# Patient Record
Sex: Female | Born: 1985 | Race: Black or African American | Hispanic: No | Marital: Single | Smoking: Current every day smoker
Health system: Southern US, Community
[De-identification: ages and names within clinical notes are randomized; demographics above are authoritative.]

## PROBLEM LIST (undated history)

## (undated) HISTORY — PX: BREAST SURGERY: SHX581

---

## 2008-07-25 ENCOUNTER — Emergency Department (HOSPITAL_COMMUNITY): Admission: EM | Admit: 2008-07-25 | Discharge: 2008-07-25 | Payer: Self-pay | Admitting: Emergency Medicine

## 2008-10-04 ENCOUNTER — Inpatient Hospital Stay (HOSPITAL_COMMUNITY): Admission: AD | Admit: 2008-10-04 | Discharge: 2008-10-04 | Payer: Self-pay | Admitting: Obstetrics & Gynecology

## 2010-12-18 LAB — GC/CHLAMYDIA PROBE AMP, GENITAL: GC Probe Amp, Genital: NEGATIVE

## 2010-12-18 LAB — URINALYSIS, ROUTINE W REFLEX MICROSCOPIC
Bilirubin Urine: NEGATIVE
Hgb urine dipstick: NEGATIVE
Protein, ur: NEGATIVE mg/dL
Specific Gravity, Urine: 1.01 (ref 1.005–1.030)
Urobilinogen, UA: 0.2 mg/dL (ref 0.0–1.0)

## 2010-12-18 LAB — WET PREP, GENITAL: Trich, Wet Prep: NONE SEEN

## 2011-06-04 LAB — POCT URINALYSIS DIP (DEVICE)
Glucose, UA: NEGATIVE mg/dL
Hgb urine dipstick: NEGATIVE
Nitrite: NEGATIVE
Urobilinogen, UA: 1 mg/dL (ref 0.0–1.0)
pH: 6 (ref 5.0–8.0)

## 2013-07-13 ENCOUNTER — Encounter (HOSPITAL_COMMUNITY): Payer: Self-pay | Admitting: Emergency Medicine

## 2013-07-13 ENCOUNTER — Emergency Department (HOSPITAL_COMMUNITY)
Admission: EM | Admit: 2013-07-13 | Discharge: 2013-07-13 | Disposition: A | Discharge status: Home / Self Care | Attending: Emergency Medicine | Admitting: Emergency Medicine

## 2013-07-13 ENCOUNTER — Emergency Department (HOSPITAL_COMMUNITY)

## 2013-07-13 DIAGNOSIS — R1013 Epigastric pain: Secondary | ICD-10-CM

## 2013-07-13 DIAGNOSIS — M549 Dorsalgia, unspecified: Secondary | ICD-10-CM | POA: Insufficient documentation

## 2013-07-13 DIAGNOSIS — Z3202 Encounter for pregnancy test, result negative: Secondary | ICD-10-CM | POA: Insufficient documentation

## 2013-07-13 DIAGNOSIS — K2970 Gastritis, unspecified, without bleeding: Secondary | ICD-10-CM

## 2013-07-13 LAB — URINALYSIS, ROUTINE W REFLEX MICROSCOPIC
Bilirubin Urine: NEGATIVE
Hgb urine dipstick: NEGATIVE
Nitrite: NEGATIVE
Specific Gravity, Urine: 1.027 (ref 1.005–1.030)
pH: 6 (ref 5.0–8.0)

## 2013-07-13 LAB — COMPREHENSIVE METABOLIC PANEL
ALT: 30 U/L (ref 0–35)
Albumin: 4.1 g/dL (ref 3.5–5.2)
Alkaline Phosphatase: 63 U/L (ref 39–117)
Potassium: 3.8 mEq/L (ref 3.5–5.1)
Sodium: 137 mEq/L (ref 135–145)
Total Protein: 8.2 g/dL (ref 6.0–8.3)

## 2013-07-13 LAB — CBC WITH DIFFERENTIAL/PLATELET
Basophils Absolute: 0 10*3/uL (ref 0.0–0.1)
Basophils Relative: 1 % (ref 0–1)
Eosinophils Absolute: 0.1 10*3/uL (ref 0.0–0.7)
MCH: 29.3 pg (ref 26.0–34.0)
MCHC: 34 g/dL (ref 30.0–36.0)
Neutrophils Relative %: 46 % (ref 43–77)
Platelets: 254 10*3/uL (ref 150–400)
RBC: 4.92 MIL/uL (ref 3.87–5.11)
RDW: 12.8 % (ref 11.5–15.5)

## 2013-07-13 LAB — POCT I-STAT TROPONIN I: Troponin i, poc: 0 ng/mL (ref 0.00–0.08)

## 2013-07-13 LAB — POCT PREGNANCY, URINE: Preg Test, Ur: NEGATIVE

## 2013-07-13 MED ORDER — GI COCKTAIL ~~LOC~~
30.0000 mL | Freq: Once | ORAL | Status: AC
  Administered 2013-07-13: 30 mL via ORAL
  Filled 2013-07-13: qty 30

## 2013-07-13 MED ORDER — ONDANSETRON HCL 4 MG PO TABS: 4.0000 mg | ORAL_TABLET | Freq: Four times a day (QID) | ORAL | Status: AC

## 2013-07-13 MED ORDER — MORPHINE SULFATE 4 MG/ML IJ SOLN
4.0000 mg | Freq: Once | INTRAMUSCULAR | Status: AC
  Administered 2013-07-13: 4 mg via INTRAVENOUS
  Filled 2013-07-13: qty 1

## 2013-07-13 MED ORDER — HYDROCODONE-ACETAMINOPHEN 5-325 MG PO TABS: 2.0000 | ORAL_TABLET | Freq: Four times a day (QID) | ORAL | Status: AC | PRN

## 2013-07-13 MED ORDER — RANITIDINE HCL 150 MG PO CAPS: 150.0000 mg | ORAL_CAPSULE | Freq: Every day | ORAL | Status: AC

## 2013-07-13 MED ORDER — ONDANSETRON HCL 4 MG/2ML IJ SOLN
4.0000 mg | Freq: Once | INTRAMUSCULAR | Status: AC
  Administered 2013-07-13: 4 mg via INTRAVENOUS
  Filled 2013-07-13: qty 2

## 2013-07-13 MED ORDER — SODIUM CHLORIDE 0.9 % IV BOLUS (SEPSIS)
1000.0000 mL | Freq: Once | INTRAVENOUS | Status: AC
  Administered 2013-07-13: 1000 mL via INTRAVENOUS

## 2013-07-13 NOTE — ED Provider Notes (Signed)
Medical screening examination/treatment/procedure(s) were conducted as a shared visit with non-physician practitioner(s) and myself.  I personally evaluated the patient during the encounter.  EKG Interpretation     Ventricular Rate:  90 PR Interval:  201 QRS Duration: 66 QT Interval:  333 QTC Calculation: 407 R Axis:   55 Text Interpretation:  Sinus rhythm Probable left atrial enlargement Baseline wander No old tracing to compare              27 yo female with epigastric abdominal pain.  On exam, soft, TTP in epigastrium, no r/r/g.  Labs and US unremarkable.  No signs of surgical abdomen.  Plan continued outpatient treatment.  Clinical Impression: 1. Epigastric pain   2. Gastritis       Candyce Churn, MD 07/13/13 [phone]

## 2013-07-13 NOTE — ED Provider Notes (Signed)
Medical screening examination/treatment/procedure(s) were conducted as a shared visit with non-physician practitioner(s) and myself.  I personally evaluated the patient during the encounter.   Please see my separate note.     Candyce Churn, MD 07/13/13 [phone]

## 2013-07-13 NOTE — ED Notes (Signed)
Pt is here with mid to upper abdominal pain for the last 3 days, seen at urgent care yesterday and started on omeprazole.  Pt now reports vomited once last nite and twice this am..  LMP last week

## 2013-07-13 NOTE — ED Provider Notes (Signed)
CSN: [id number]     Arrival date & time 07/13/13  7829 History   First MD Initiated Contact with Patient 07/13/13 [phone]     Chief Complaint  Patient presents with  . Abdominal Pain   (Consider location/radiation/quality/duration/timing/severity/associated sxs/prior Treatment) HPI Comments: Patient is a 27 year old G3 P0 female who presents today with 3 days of worsening abdominal pain. She reports the pain is a dull achy pain in her epigastric area with some radiation to her back. She was seen at urgent care when the pain started 3 days ago and was given omeprazole. She's been taking the omeprazole and no relief of her symptoms. She has been taking omeprazole as well as Midol which has not helped her symptoms. The Midol helps her sleep at night. She has had associated vomiting. Her emesis is nonbloody. Her last bowel movement was yesterday. She denies any darkening of her stools or hematochezia. She does note that it appears her stools are somewhat looser than normal. Eating does not make her pain better or worse. She has only been eating crackers for the past few days. She denies any fever, chills, vaginal discharge, vaginal bleeding, dysuria, urinary urgency, urinary frequency. Last menstrual period was last week.  The history is provided by the patient. No language interpreter was used.    No past medical history on file. No past surgical history on file. No family history on file. History  Substance Use Topics  . Smoking status: Not on file  . Smokeless tobacco: Not on file  . Alcohol Use: Not on file   OB History   No data available     Review of Systems  Constitutional: Negative for fever and chills.  Respiratory: Negative for shortness of breath.   Cardiovascular: Negative for chest pain.  Gastrointestinal: Positive for nausea, vomiting, abdominal pain and abdominal distention. Negative for diarrhea, constipation and blood in stool.  Genitourinary: Negative for dysuria, urgency,  frequency, hematuria, vaginal discharge and vaginal pain.  Musculoskeletal: Positive for back pain.  All other systems reviewed and are negative.    Allergies  Review of patient's allergies indicates no known allergies.  Home Medications  No current outpatient prescriptions on file. BP 145/84  Pulse 82  Temp(Src) 98.8 F (37.1 C) (Oral)  SpO2 99% Physical Exam  Nursing note and vitals reviewed. Constitutional: She is oriented to person, place, and time. She appears well-developed and well-nourished. No distress.  HENT:  Head: Normocephalic and atraumatic.  Right Ear: External ear normal.  Left Ear: External ear normal.  Nose: Nose normal.  Mouth/Throat: Oropharynx is clear and moist.  Eyes: Conjunctivae are normal.  Neck: Normal range of motion.  Cardiovascular: Normal rate, regular rhythm and normal heart sounds.   Pulmonary/Chest: Effort normal and breath sounds normal. No stridor. No respiratory distress. She has no wheezes. She has no rales.  Abdominal: Soft. She exhibits no distension. There is tenderness in the epigastric area. There is no rigidity, no rebound, no guarding, no tenderness at McBurney's point and negative Murphy's sign.  Musculoskeletal: Normal range of motion.  Neurological: She is alert and oriented to person, place, and time. She has normal strength.  Skin: Skin is warm and dry. She is not diaphoretic. No erythema.  Psychiatric: She has a normal mood and affect. Her behavior is normal.    ED Course  Procedures (including critical care time) Labs Review Labs Reviewed  CBC WITH DIFFERENTIAL - Abnormal; Notable for the following:    WBC 3.6 (*)  Neutro Abs 1.6 (*)    Monocytes Relative 13 (*)    All other components within normal limits  COMPREHENSIVE METABOLIC PANEL  LIPASE, BLOOD  URINALYSIS, ROUTINE W REFLEX MICROSCOPIC  POCT PREGNANCY, URINE  POCT I-STAT TROPONIN I   Imaging Review US Abdomen Complete  07/13/2013   CLINICAL DATA:  Pain.   EXAM: ULTRASOUND ABDOMEN COMPLETE  COMPARISON:  None.  FINDINGS: Gallbladder  No gallstones or wall thickening visualized. No sonographic Murphy sign noted.  Common bile duct  Diameter: 4.7 mm.  Liver  No focal lesion identified. Within normal limits in parenchymal echogenicity.  IVC  No abnormality visualized.  Pancreas  Visualized portion unremarkable.  Spleen  Size and appearance within normal limits.  Right Kidney  Length: 10.9 cm. Echogenicity within normal limits. No mass or hydronephrosis visualized.  Left Kidney  Length: 11.4 cm. Echogenicity within normal limits. No mass or hydronephrosis visualized.  Abdominal aorta  No aneurysm visualized.  IMPRESSION: Negative exam.   Electronically Signed   By: Maisie Fus  Register   On: 07/13/2013 11:10    EKG Interpretation   None      11:49 AM Informed patient of labs and imaging. She is laughing and joking on the phone. Abd is soft and non surgical. She still complains of epigastric pain.   1:38 PM Patient worse after GI cocktail. Abd soft, nonsurgical. Dr. Loretha Stapler evaluated patient. Will give morphine and d/c with follow up once patient is more comfortable.   MDM   1. Epigastric pain   2. Gastritis     Patient is nontoxic, nonseptic appearing, in no apparent distress.  Patient's pain and other symptoms adequately managed in emergency department.  Fluid bolus given.  Labs, imaging and vitals reviewed.  Patient does not meet the SIRS or Sepsis criteria.  On repeat exam patient does not have a surgical abdomen and there are no peritoneal signs.  No indication of appendicitis, bowel obstruction, bowel perforation, cholecystitis, diverticulitis, PID or ectopic pregnancy.  Patient discharged home with symptomatic treatment and given strict instructions for follow-up with their primary care physician.  I have also discussed reasons to return immediately to the ER.  Patient expresses understanding and agrees with plan. Dr. Loretha Stapler evaluated patient and  agrees with plan. Patient is asymptomatic prior to discharge.   Mora Bellman, PA-C 07/13/13 [phone]

## 2014-12-02 ENCOUNTER — Encounter (HOSPITAL_COMMUNITY): Payer: Self-pay | Admitting: Vascular Surgery

## 2014-12-02 ENCOUNTER — Emergency Department (HOSPITAL_COMMUNITY)
Admission: EM | Admit: 2014-12-02 | Discharge: 2014-12-02 | Disposition: A | Discharge status: Home / Self Care | Attending: Emergency Medicine | Admitting: Emergency Medicine

## 2014-12-02 DIAGNOSIS — Z79899 Other long term (current) drug therapy: Secondary | ICD-10-CM | POA: Diagnosis not present

## 2014-12-02 DIAGNOSIS — J029 Acute pharyngitis, unspecified: Secondary | ICD-10-CM

## 2014-12-02 DIAGNOSIS — Z720 Tobacco use: Secondary | ICD-10-CM | POA: Diagnosis not present

## 2014-12-02 LAB — RAPID STREP SCREEN (MED CTR MEBANE ONLY): Streptococcus, Group A Screen (Direct): NEGATIVE

## 2014-12-02 MED ORDER — ACETAMINOPHEN 500 MG PO TABS
1000.0000 mg | ORAL_TABLET | Freq: Once | ORAL | Status: AC
  Administered 2014-12-02: 1000 mg via ORAL
  Filled 2014-12-02: qty 2

## 2014-12-02 NOTE — ED Provider Notes (Signed)
CSN: [id number]     Arrival date & time 12/02/14  1520 History   First MD Initiated Contact with Patient 12/02/14 [phone]     Chief Complaint  Patient presents with  . Sore Throat     (Consider location/radiation/quality/duration/timing/severity/associated sxs/prior Treatment) HPI Comments: 29 yo healthy female presents with sore throat worse the past two days with exudate right tonsil, no sick contacts, well otherwise tolerating po.  Mild pain with swallowing.  Patient is a 29 y.o. female presenting with pharyngitis. The history is provided by the patient.  Sore Throat Pertinent negatives include no headaches.    History reviewed. No pertinent past medical history. Past Surgical History  Procedure Laterality Date  . Breast surgery      reduction   No family history on file. History  Substance Use Topics  . Smoking status: Current Every Day Smoker    Types: Cigarettes  . Smokeless tobacco: Not on file  . Alcohol Use: Yes   OB History    No data available     Review of Systems  Constitutional: Negative for fever, chills and appetite change.  HENT: Positive for sore throat.   Neurological: Negative for headaches.      Allergies  Review of patient's allergies indicates no known allergies.  Home Medications   Prior to Admission medications   Medication Sig Start Date End Date Taking? Authorizing Provider  calcium carbonate (TUMS - DOSED IN MG ELEMENTAL CALCIUM) 500 MG chewable tablet Chew 4-5 tablets by mouth daily as needed for indigestion or heartburn.    Historical Provider, MD  HYDROcodone-acetaminophen (NORCO/VICODIN) 5-325 MG per tablet Take 2 tablets by mouth every 6 (six) hours as needed. 07/13/13   Junious Silk, PA-C  Ibuprofen (MIDOL) 200 MG CAPS Take 400 mg by mouth daily as needed (for pain).    Historical Provider, MD  omeprazole (PRILOSEC) 20 MG capsule Take 20 mg by mouth daily as needed (for heartburn).    Historical Provider, MD  ondansetron (ZOFRAN) 4  MG tablet Take 1 tablet (4 mg total) by mouth every 6 (six) hours. 07/13/13   Junious Silk, PA-C  ranitidine (ZANTAC) 150 MG capsule Take 1 capsule (150 mg total) by mouth daily. 07/13/13   Junious Silk, PA-C  Simethicone (GAS-X PO) Take 2-3 tablets by mouth daily as needed (for gas).    Historical Provider, MD   BP 122/85 mmHg  Pulse 103  Temp(Src) 98.4 F (36.9 C) (Oral)  Resp 18  Ht  (1.753 m)  Wt 205 lb (92.987 kg)  BMI 30.26 kg/m2  SpO2 100%  LMP 10/23/2014 Physical Exam  Constitutional: She appears well-developed and well-nourished. No distress.  HENT:  Head: Normocephalic and atraumatic.  No trismus, uvular deviation, unilateral posterior pharyngeal edema or submandibular swelling. Mild posterior erythema  Eyes: Conjunctivae are normal. Right eye exhibits no discharge. Left eye exhibits discharge.  Neck: Normal range of motion. Neck supple.  Lymphadenopathy:    She has cervical adenopathy (mild anterior).  Nursing note and vitals reviewed.   ED Course  Procedures (including critical care time) Labs Review Labs Reviewed  RAPID STREP SCREEN  CULTURE, GROUP A STREP    Imaging Review No results found.   EKG Interpretation None      MDM   Final diagnoses:  Pharyngitis    Clinically pharyngitis, mild erythema, no red flags. Strep and fup outpt.  Results and differential diagnosis were discussed with the patient/parent/guardian. Close follow up outpatient was discussed, comfortable with the plan.  Medications  acetaminophen (TYLENOL) tablet 1,000 mg (1,000 mg Oral Given 12/02/14 1624)    Filed Vitals:   12/02/14 1533  BP: 122/85  Pulse: 103  Temp: 98.4 F (36.9 C)  TempSrc: Oral  Resp: 18  Height: 5\' 9"  (1.753 m)  Weight: 205 lb (92.987 kg)  SpO2: 100%    Final diagnoses:  Pharyngitis        Blane OharaJoshua Marquelle Musgrave, MD 12/02/14 1651

## 2014-12-02 NOTE — Discharge Instructions (Signed)
If you were given medicines take as directed.  If you are on coumadin or contraceptives realize their levels and effectiveness is altered by many different medicines.  If you have any reaction (rash, tongues swelling, other) to the medicines stop taking and see a physician.   Please follow up as directed and return to the ER or see a physician for new or worsening symptoms.  Thank you. Filed Vitals:   12/02/14 1533  BP: 122/85  Pulse: 103  Temp: 98.4 F (36.9 C)  TempSrc: Oral  Resp: 18  Height: 5\' 9"  (1.753 m)  Weight: 205 lb (92.987 kg)  SpO2: 100%

## 2014-12-02 NOTE — ED Notes (Signed)
Pt reports to the ED for eval of sore throat that began last week. She reports the symptoms got worse 2 days ago. She noticed yesterday that she had some white exudate on her tonsils yesterday. None visible now. Tonsils slightly red and swollen. Airway intact. Pt A&Ox4, resp e/u, and skin warm and dry.

## 2014-12-05 LAB — CULTURE, GROUP A STREP: Strep A Culture: NEGATIVE

## 2015-04-05 IMAGING — US US ABDOMEN COMPLETE
1 series · 14 of 25 positions shown · non-contrast
Comparison: None.

CLINICAL DATA: Pain.

EXAM:
ULTRASOUND ABDOMEN COMPLETE

[Series 1: us abdomen complete · 0.20mm/px · 14 of 76 slices shown]
[im 1/76]
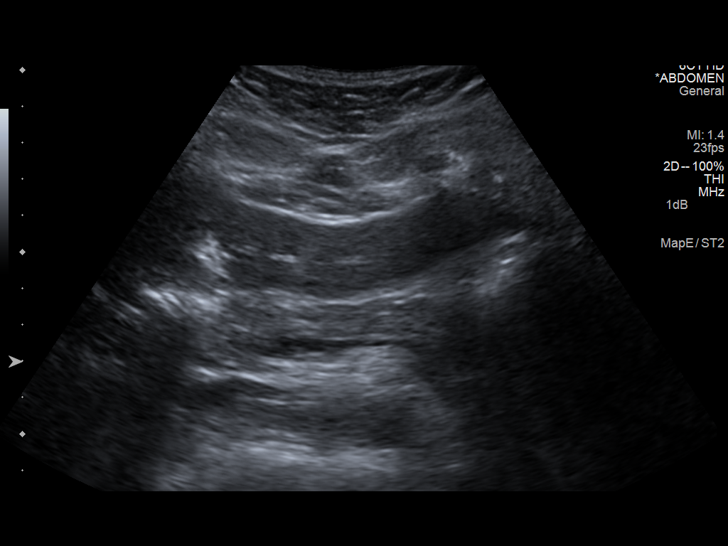
[im 7/76]
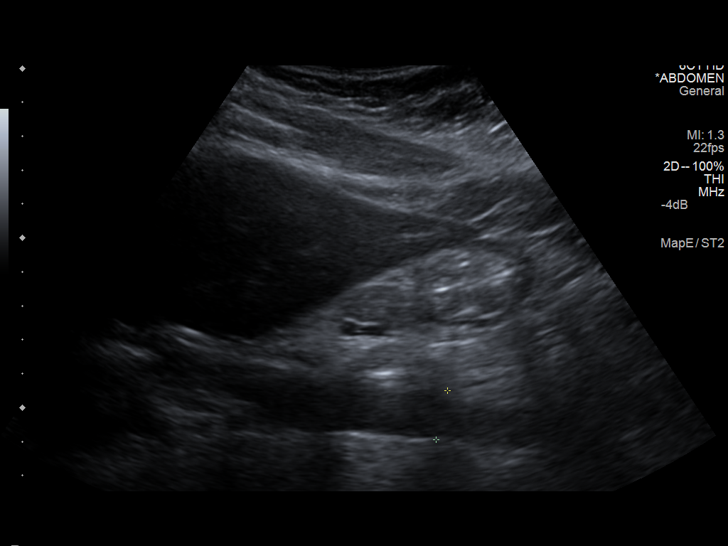
[im 13/76]
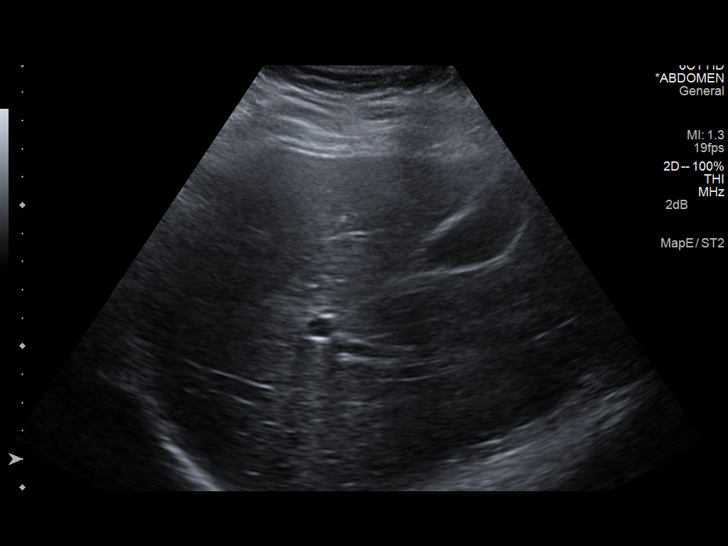
[im 19/76]
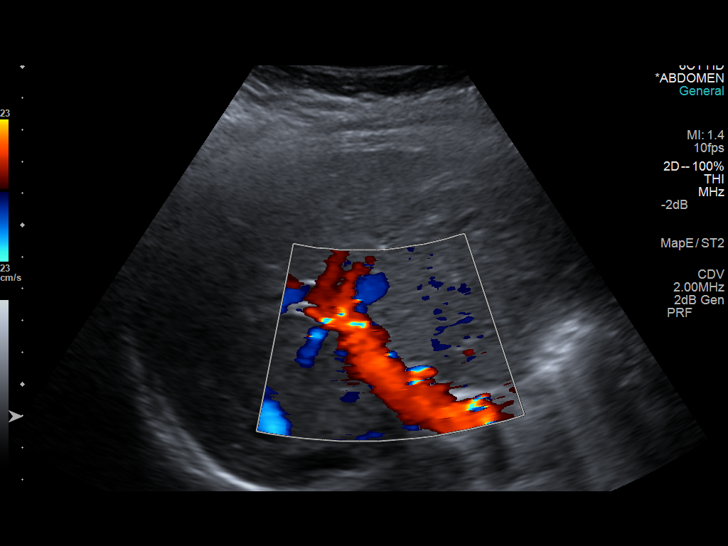
[im 26/76]
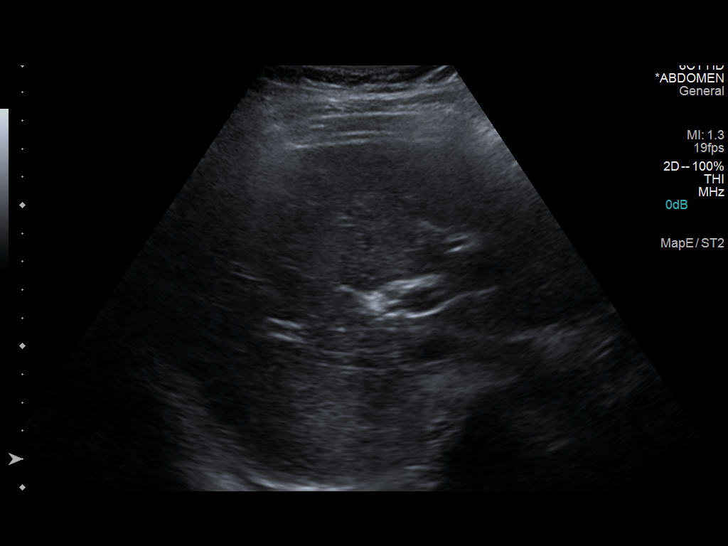
[im 29/76]
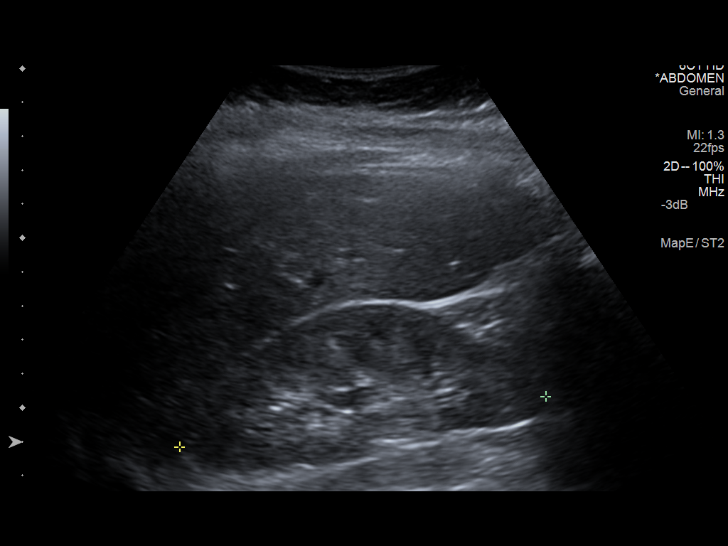
[im 35/76]
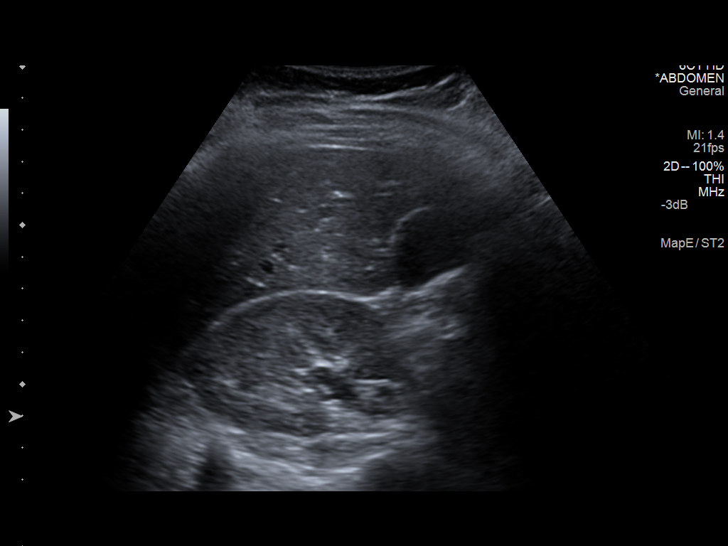
[im 41/76]
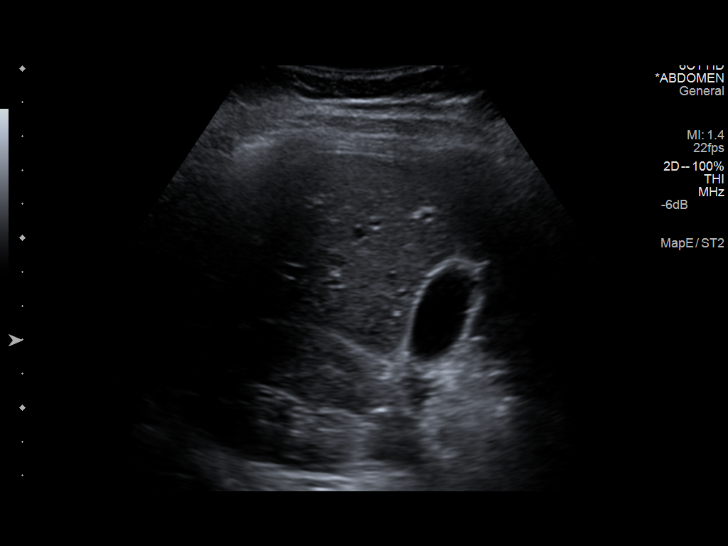
[im 47/76]
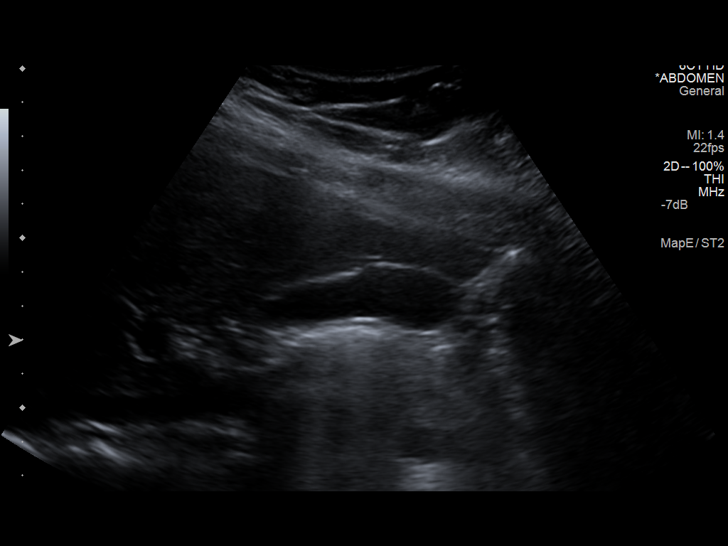
[im 51/76]
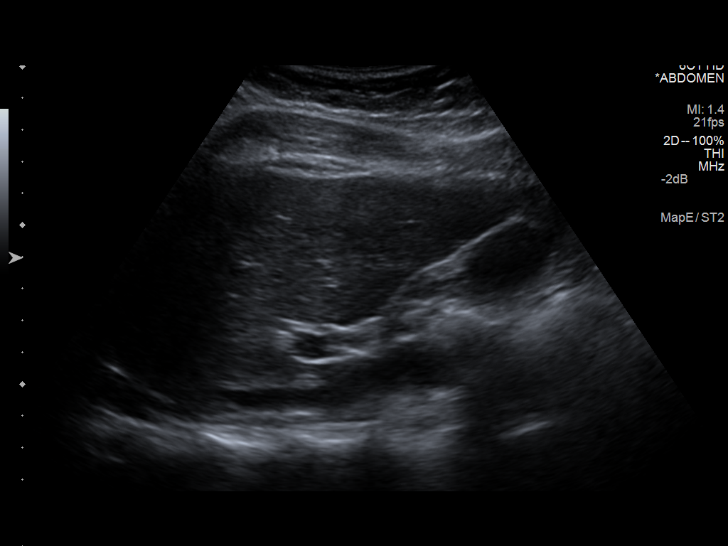
[im 57/76]
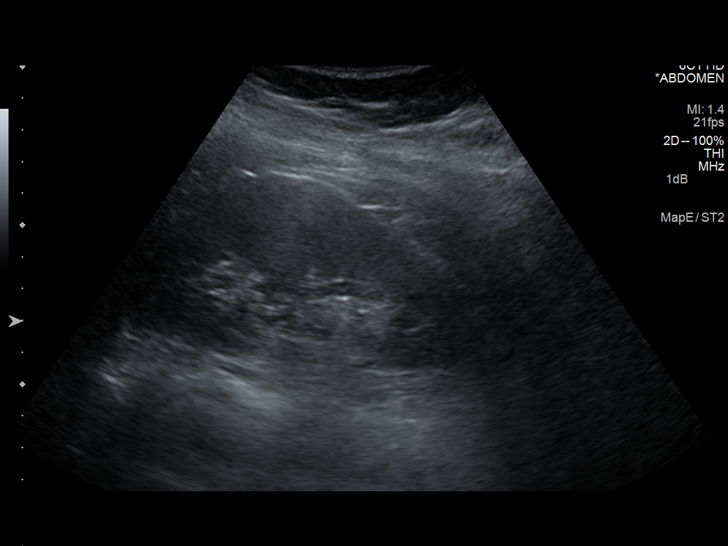
[im 63/76]
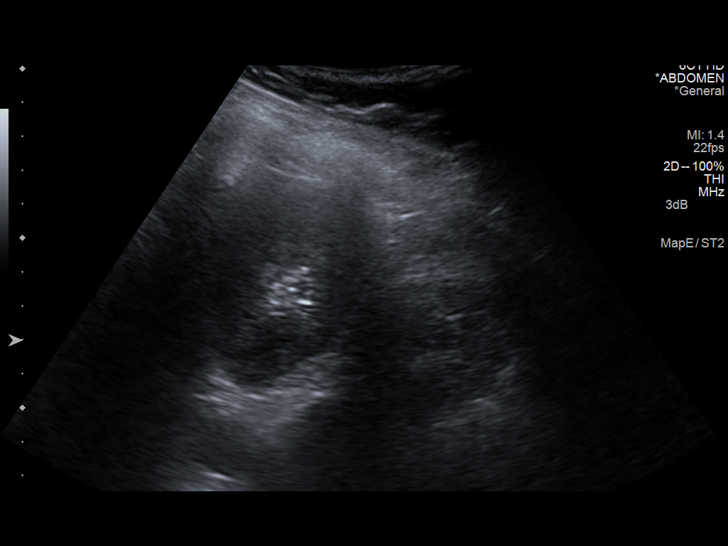
[im 69/76]
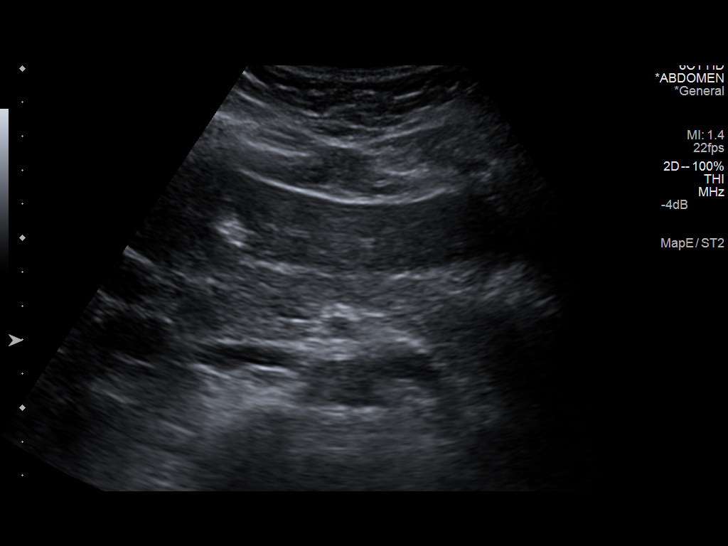
[im 76/76]
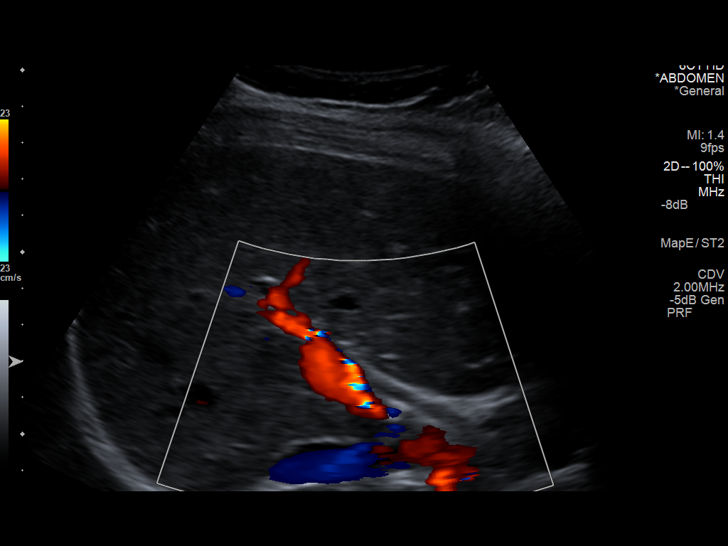

[14 of 25 positions shown; findings below may reference images not displayed]

FINDINGS: Gallbladder

No gallstones or wall thickening visualized. No sonographic Murphy
sign noted.

Common bile duct

Diameter: 4.7 mm.

Liver

No focal lesion identified. Within normal limits in parenchymal
echogenicity.

IVC

No abnormality visualized.

Pancreas

Visualized portion unremarkable.

Spleen

Size and appearance within normal limits.

Right Kidney

Length: 10.9 cm. Echogenicity within normal limits. No mass or
hydronephrosis visualized.

Left Kidney

Length: 11.4 cm. Echogenicity within normal limits. No mass or
hydronephrosis visualized.

Abdominal aorta

No aneurysm visualized.
IMPRESSION: Negative exam.
